# Patient Record
Sex: Female | Born: 1985 | Race: White | Hispanic: No | Marital: Single | Smoking: Current every day smoker
Health system: Southern US, Community
[De-identification: ages and names within clinical notes are randomized; demographics above are authoritative.]

## PROBLEM LIST (undated history)

## (undated) HISTORY — PX: TONSILLECTOMY: SUR1361

---
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
## 2007-01-31 ENCOUNTER — Emergency Department: Payer: Self-pay | Admitting: Emergency Medicine

## 2008-04-22 ENCOUNTER — Emergency Department: Payer: Self-pay | Admitting: Emergency Medicine

## 2009-03-30 ENCOUNTER — Emergency Department: Payer: Self-pay | Admitting: Emergency Medicine

## 2009-11-20 ENCOUNTER — Emergency Department: Payer: Self-pay | Admitting: Emergency Medicine

## 2010-12-21 ENCOUNTER — Emergency Department: Payer: Self-pay | Admitting: Emergency Medicine

## 2012-01-26 ENCOUNTER — Emergency Department: Payer: Self-pay | Admitting: Emergency Medicine

## 2012-01-26 LAB — URINALYSIS, COMPLETE
Bacteria: NONE SEEN
Bilirubin,UR: NEGATIVE
Glucose,UR: NEGATIVE mg/dL (ref 0–75)
Nitrite: POSITIVE
Ph: 7 (ref 4.5–8.0)
Protein: 100
RBC,UR: 198 /HPF (ref 0–5)
Specific Gravity: 1.017 (ref 1.003–1.030)
Squamous Epithelial: 1
WBC UR: 84 /HPF (ref 0–5)

## 2012-01-26 LAB — BASIC METABOLIC PANEL
Anion Gap: 9 (ref 7–16)
BUN: 7 mg/dL (ref 7–18)
Calcium, Total: 9.5 mg/dL (ref 8.5–10.1)
Chloride: 109 mmol/L — ABNORMAL HIGH (ref 98–107)
Co2: 24 mmol/L (ref 21–32)
Creatinine: 0.84 mg/dL (ref 0.60–1.30)
EGFR (African American): >60
EGFR (Non-African Amer.): >60
Glucose: 97 mg/dL (ref 65–99)
Osmolality: 281 (ref 275–301)
Potassium: 4 mmol/L (ref 3.5–5.1)
Sodium: 142 mmol/L (ref 136–145)

## 2012-01-26 LAB — CBC
HCT: 45.7 % (ref 35.0–47.0)
HGB: 15.3 g/dL (ref 12.0–16.0)
MCH: 31.4 pg (ref 26.0–34.0)
MCHC: 33.5 g/dL (ref 32.0–36.0)
MCV: 94 fL (ref 80–100)
Platelet: 280 10*3/uL (ref 150–440)
RBC: 4.88 10*6/uL (ref 3.80–5.20)
RDW: 14 % (ref 11.5–14.5)
WBC: 20.5 10*3/uL — ABNORMAL HIGH (ref 3.6–11.0)

## 2012-01-26 LAB — DIFFERENTIAL
Basophil #: 0.1 10*3/uL (ref 0.0–0.1)
Basophil %: 0.3 %
Eosinophil #: 0 10*3/uL (ref 0.0–0.7)
Eosinophil %: 0 %
Lymphocyte #: 2 10*3/uL (ref 1.0–3.6)
Lymphocyte %: 9.9 %
Monocyte #: 0.9 x10 3/mm (ref 0.2–0.9)
Monocyte %: 4.3 %
Neutrophil #: 17.5 10*3/uL — ABNORMAL HIGH (ref 1.4–6.5)
Neutrophil %: 85.5 %

## 2012-01-26 LAB — HCG, QUANTITATIVE, PREGNANCY: Beta Hcg, Quant.: <1 m[IU]/mL — ABNORMAL LOW

## 2012-01-26 LAB — PREGNANCY, URINE: Pregnancy Test, Urine: NEGATIVE m[IU]/mL

## 2012-12-14 ENCOUNTER — Emergency Department: Payer: Self-pay | Admitting: Emergency Medicine

## 2013-02-09 ENCOUNTER — Emergency Department: Payer: Self-pay | Admitting: Emergency Medicine

## 2013-02-11 ENCOUNTER — Emergency Department: Payer: Self-pay | Admitting: Emergency Medicine

## 2013-02-12 ENCOUNTER — Emergency Department: Payer: Self-pay | Admitting: Emergency Medicine

## 2013-10-31 ENCOUNTER — Emergency Department: Payer: Self-pay | Admitting: Emergency Medicine

## 2013-12-17 ENCOUNTER — Emergency Department: Payer: Self-pay | Admitting: Emergency Medicine

## 2014-09-01 ENCOUNTER — Emergency Department: Payer: Self-pay | Admitting: Internal Medicine

## 2014-09-01 LAB — CBC
HCT: 46.5 % (ref 35.0–47.0)
HGB: 15.3 g/dL (ref 12.0–16.0)
MCH: 31.4 pg (ref 26.0–34.0)
MCHC: 33 g/dL (ref 32.0–36.0)
MCV: 95 fL (ref 80–100)
Platelet: 287 10*3/uL (ref 150–440)
RBC: 4.89 10*6/uL (ref 3.80–5.20)
RDW: 13.8 % (ref 11.5–14.5)
WBC: 15.7 10*3/uL — ABNORMAL HIGH (ref 3.6–11.0)

## 2014-09-01 LAB — COMPREHENSIVE METABOLIC PANEL
Albumin: 3.9 g/dL (ref 3.4–5.0)
Alkaline Phosphatase: 65 U/L (ref 46–116)
Anion Gap: 7 (ref 7–16)
BUN: 11 mg/dL (ref 7–18)
Bilirubin,Total: 0.4 mg/dL (ref 0.2–1.0)
Calcium, Total: 9.1 mg/dL (ref 8.5–10.1)
Chloride: 107 mmol/L (ref 98–107)
Co2: 26 mmol/L (ref 21–32)
Creatinine: 0.77 mg/dL (ref 0.60–1.30)
EGFR (African American): >60
EGFR (Non-African Amer.): >60
Glucose: 84 mg/dL (ref 65–99)
Osmolality: 278 (ref 275–301)
Potassium: 3.8 mmol/L (ref 3.5–5.1)
SGOT(AST): 29 U/L (ref 15–37)
SGPT (ALT): 16 U/L (ref 14–63)
Sodium: 140 mmol/L (ref 136–145)
Total Protein: 7.4 g/dL (ref 6.4–8.2)

## 2014-09-01 LAB — URINALYSIS, COMPLETE
Bilirubin,UR: NEGATIVE
Blood: NEGATIVE
Glucose,UR: NEGATIVE mg/dL (ref 0–75)
Ketone: NEGATIVE
Nitrite: NEGATIVE
Ph: 6 (ref 4.5–8.0)
Protein: NEGATIVE
RBC,UR: 6 /HPF (ref 0–5)
Specific Gravity: 1.029 (ref 1.003–1.030)
Squamous Epithelial: 10
WBC UR: 12 /HPF (ref 0–5)

## 2014-09-01 LAB — LIPASE, BLOOD: Lipase: 132 U/L (ref 73–393)

## 2014-09-02 LAB — URINE CULTURE

## 2015-10-23 ENCOUNTER — Emergency Department
Admission: EM | Admit: 2015-10-23 | Discharge: 2015-10-23 | Disposition: A | Discharge status: Home / Self Care | Payer: Self-pay | Attending: Emergency Medicine | Admitting: Emergency Medicine

## 2015-10-23 ENCOUNTER — Encounter: Payer: Self-pay | Admitting: Medical Oncology

## 2015-10-23 DIAGNOSIS — G501 Atypical facial pain: Secondary | ICD-10-CM | POA: Insufficient documentation

## 2015-10-23 DIAGNOSIS — J301 Allergic rhinitis due to pollen: Secondary | ICD-10-CM | POA: Insufficient documentation

## 2015-10-23 DIAGNOSIS — F17200 Nicotine dependence, unspecified, uncomplicated: Secondary | ICD-10-CM | POA: Insufficient documentation

## 2015-10-23 MED ORDER — BENZONATATE 100 MG PO CAPS: 100.0000 mg | ORAL_CAPSULE | Freq: Three times a day (TID) | ORAL | Status: DC | PRN

## 2015-10-23 MED ORDER — FLUTICASONE PROPIONATE 50 MCG/ACT NA SUSP: 2.0000 | Freq: Every day | NASAL | Status: DC

## 2015-10-23 MED ORDER — CETIRIZINE-PSEUDOEPHEDRINE ER 5-120 MG PO TB12
1.0000 | ORAL_TABLET | Freq: Two times a day (BID) | ORAL | Status: DC
Start: 2015-10-23 — End: 2017-09-10

## 2015-10-23 NOTE — ED Provider Notes (Signed)
[facility] Emergency Department Provider Note ____________________________________________  Time seen: 806  I have reviewed the triage vital signs and the nursing notes.  HISTORY  Chief Complaint  Facial Pain and Nasal Congestion  HPI Anna Aguirre is a 30 y.o. female presents to the ED for evaluation management of 3 days complaint of sinus pain and nasal congestion. She is also noticed some intermittent fevers with her symptoms. She denies a history of seasonal allergy symptoms, but does note onset after she spent the day until in the garden. She is been taking TheraFlu and ibuprofen for symptom management, but denies any significant benefit. She describes burning eyes, stuffy nose, clear runny nose, and postnasal drainage. She reports her pain at 10/10 in triage.   History reviewed. No pertinent past medical history.  There are no active problems to display for this patient.  Past Surgical History  Procedure Laterality Date  . Tonsillectomy      Current Outpatient Rx  Name  Route  Sig  Dispense  Refill  . benzonatate (TESSALON PERLES) 100 MG capsule   Oral   Take 1 capsule (100 mg total) by mouth 3 (three) times daily as needed for cough (Take 1-2 per dose).   30 capsule   0   . cetirizine-pseudoephedrine (ZYRTEC-D) 5-120 MG tablet   Oral   Take 1 tablet by mouth 2 (two) times daily.   30 tablet   0   . fluticasone (FLONASE) 50 MCG/ACT nasal spray   Each Nare   Place 2 sprays into both nostrils daily.   16 g   0     Allergies Codeine  No family history on file.  Social History Social History  Substance Use Topics  . Smoking status: Current Every Day Smoker  . Smokeless tobacco: None  . Alcohol Use: None   Review of Systems  Constitutional: Reports low-grade fever. Eyes: Negative for visual changes. ENT: Negative for sore throat. Sinus congestion and nasal drainage Cardiovascular: Negative for chest pain. Respiratory: Negative  for shortness of breath. Skin: Negative for rash. Neurological: Negative for headaches, focal weakness or numbness. ____________________________________________  PHYSICAL EXAM:  VITAL SIGNS: ED Triage Vitals  Enc Vitals Group     BP 10/23/15 0759 113/64 mmHg     Pulse Rate 10/23/15 0759 85     Resp 10/23/15 0759 20     Temp 10/23/15 0759 98.8 F (37.1 C)     Temp Source 10/23/15 0759 Oral     SpO2 10/23/15 0759 97 %     Weight 10/23/15 0759 130 lb (58.968 kg)     Height 10/23/15 0759 5\' 5"  (1.651 m)     Head Cir --      Peak Flow --      Pain Score 10/23/15 0759 10     Pain Loc --      Pain Edu? --      Excl. in GC? --    Constitutional: Alert and oriented. Well appearing and in no distress. Head: Normocephalic and atraumatic.      Eyes: Conjunctivae are normal. PERRL. Normal extraocular movements      Ears: Canals clear. TMs intact bilaterally.   Nose: Nasal turbinate edema & congestion. Clear nasal rhinorrhea.   Mouth/Throat: Mucous membranes are moist.   Neck: Supple. No thyromegaly. Hematological/Lymphatic/Immunological: No cervical lymphadenopathy. Cardiovascular: Normal rate, regular rhythm.  Respiratory: Normal respiratory effort. No wheezes/rales/rhonchi. Musculoskeletal: Nontender with normal range of motion in all extremities.  Neurologic:  Normal gait  without ataxia. Normal speech and language. No gross focal neurologic deficits are appreciated. Skin:  Skin is warm, dry and intact. No rash noted. Psychiatric: Mood and affect are normal. Patient exhibits appropriate insight and judgment. ____________________________________________  INITIAL IMPRESSION / ASSESSMENT AND PLAN / ED COURSE  Patient with symptoms consistent with an acute rhinitis likely due to allergies. She will be discharged with prescriptions for Flonase, Claritin-D, and instructions on sinus lavage. She is also encouraged to consider continuing her ibuprofen as needed. She will follow-up  with local family clinic for ongoing management. ____________________________________________  FINAL CLINICAL IMPRESSION(S) / ED DIAGNOSES  Final diagnoses:  Allergic rhinitis due to pollen      [employer], PA-C 10/23/15 1610  Emily Filbert, MD 10/23/15 1027

## 2015-10-23 NOTE — ED Notes (Signed)
States she has had some nasal congestion with slight fever since last Friday ..having some sinus pressure .Marland Kitchen.occasional non prod cough

## 2015-10-23 NOTE — ED Notes (Signed)
Pt reports sinus pain and nasal congestion with fever since Friday.

## 2015-10-23 NOTE — Discharge Instructions (Signed)
Allergic Rhinitis Allergic rhinitis is when the mucous membranes in the nose respond to allergens. Allergens are particles in the air that cause your body to have an allergic reaction. This causes you to release allergic antibodies. Through a chain of events, these eventually cause you to release histamine into the blood stream. Although meant to protect the body, it is this release of histamine that causes your discomfort, such as frequent sneezing, congestion, and an itchy, runny nose.  CAUSES Seasonal allergic rhinitis (hay fever) is caused by pollen allergens that may come from grasses, trees, and weeds. Year-round allergic rhinitis (perennial allergic rhinitis) is caused by allergens such as house dust mites, pet dander, and mold spores. SYMPTOMS  Nasal stuffiness (congestion).  Itchy, runny nose with sneezing and tearing of the eyes. DIAGNOSIS Your health care provider can help you determine the allergen or allergens that trigger your symptoms. If you and your health care provider are unable to determine the allergen, skin or blood testing may be used. Your health care provider will diagnose your condition after taking your health history and performing a physical exam. Your health care provider may assess you for other related conditions, such as asthma, pink eye, or an ear infection. TREATMENT Allergic rhinitis does not have a cure, but it can be controlled by:  Medicines that block allergy symptoms. These may include allergy shots, nasal sprays, and oral antihistamines.  Avoiding the allergen. Hay fever may often be treated with antihistamines in pill or nasal spray forms. Antihistamines block the effects of histamine. There are over-the-counter medicines that may help with nasal congestion and swelling around the eyes. Check with your health care provider before taking or giving this medicine. If avoiding the allergen or the medicine prescribed do not work, there are many new medicines  your health care provider can prescribe. Stronger medicine may be used if initial measures are ineffective. Desensitizing injections can be used if medicine and avoidance does not work. Desensitization is when a patient is given ongoing shots until the body becomes less sensitive to the allergen. Make sure you follow up with your health care provider if problems continue. HOME CARE INSTRUCTIONS It is not possible to completely avoid allergens, but you can reduce your symptoms by taking steps to limit your exposure to them. It helps to know exactly what you are allergic to so that you can avoid your specific triggers. SEEK MEDICAL CARE IF:  You have a fever.  You develop a cough that does not stop easily (persistent).  You have shortness of breath.  You start wheezing.  Symptoms interfere with normal daily activities.   This information is not intended to replace advice given to you by your health care provider. Make sure you discuss any questions you have with your health care provider.   Document Released: 04/08/2001 Document Revised: 08/04/2014 Document Reviewed: 03/21/2013 Elsevier Interactive Patient Education 2016 Elsevier Inc.  Hay Fever  Hay fever is a type of allergy that people have to things like grass, animals, or pollen from plants and flowers. It cannot be passed from one person to another. You cannot cure hay fever, but there are things that may help relieve your problems (symptoms). HOME CARE  Avoid the things that may be causing your problems.  Take all medicine as told by your doctor. GET HELP RIGHT AWAY IF:  You have asthma, a cough, and you start making whistling sounds when breathing (wheezing).  Your tongue or lips are puffy (swollen).  You have trouble  breathing.  You feel lightheaded or like you will pass out (faint).  You have a fever.  Your problems are getting worse and your medicine is not helping.  Your treatment was working, but your problems  have come back.  You are stuffed up (congested) and have pressure in your face.  You have a headache.  You have cold sweats. MAKE SURE YOU:  Understand these instructions.  Will watch your condition.  Will get help right away if you are not doing well or get worse.   This information is not intended to replace advice given to you by your health care provider. Make sure you discuss any questions you have with your health care provider.   Document Released: 11/13/2010 Document Revised: 10/06/2011 Document Reviewed: 01/24/2015 Elsevier Interactive Patient Education [employer] Elsevier Inc.  Take the prescription meds as directed. Consider using a room humidifier overnight. Follow-up with one of the community clinics for ongoing management.

## 2016-05-02 ENCOUNTER — Emergency Department
Admission: EM | Admit: 2016-05-02 | Discharge: 2016-05-02 | Disposition: A | Discharge status: Home / Self Care | Payer: Self-pay | Attending: Student in an Organized Health Care Education/Training Program | Admitting: Student in an Organized Health Care Education/Training Program

## 2016-05-02 ENCOUNTER — Emergency Department: Payer: Self-pay

## 2016-05-02 DIAGNOSIS — R109 Unspecified abdominal pain: Secondary | ICD-10-CM

## 2016-05-02 DIAGNOSIS — N12 Tubulo-interstitial nephritis, not specified as acute or chronic: Secondary | ICD-10-CM | POA: Insufficient documentation

## 2016-05-02 DIAGNOSIS — F17200 Nicotine dependence, unspecified, uncomplicated: Secondary | ICD-10-CM | POA: Insufficient documentation

## 2016-05-02 DIAGNOSIS — R1031 Right lower quadrant pain: Secondary | ICD-10-CM

## 2016-05-02 LAB — CBC WITH DIFFERENTIAL/PLATELET
BASOS PCT: 1 %
Basophils Absolute: 0.1 10*3/uL (ref 0–0.1)
EOS ABS: 0.1 10*3/uL (ref 0–0.7)
Eosinophils Relative: 1 %
HCT: 44.5 % (ref 35.0–47.0)
Hemoglobin: 15.4 g/dL (ref 12.0–16.0)
Lymphocytes Relative: 20 %
Lymphs Abs: 2.3 10*3/uL (ref 1.0–3.6)
MCH: 31.2 pg (ref 26.0–34.0)
MCHC: 34.7 g/dL (ref 32.0–36.0)
MCV: 89.9 fL (ref 80.0–100.0)
MONO ABS: 1 10*3/uL — AB (ref 0.2–0.9)
MONOS PCT: 8 %
Neutro Abs: 8.5 10*3/uL — ABNORMAL HIGH (ref 1.4–6.5)
Neutrophils Relative %: 70 %
PLATELETS: 272 10*3/uL (ref 150–440)
RBC: 4.95 MIL/uL (ref 3.80–5.20)
RDW: 14 % (ref 11.5–14.5)
WBC: 12 10*3/uL — ABNORMAL HIGH (ref 3.6–11.0)

## 2016-05-02 LAB — URINALYSIS COMPLETE WITH MICROSCOPIC (ARMC ONLY)
Bilirubin Urine: NEGATIVE
Glucose, UA: NEGATIVE mg/dL
Hgb urine dipstick: NEGATIVE
KETONES UR: NEGATIVE mg/dL
NITRITE: POSITIVE — AB
PH: 6 (ref 5.0–8.0)
PROTEIN: NEGATIVE mg/dL
SPECIFIC GRAVITY, URINE: 1.02 (ref 1.005–1.030)

## 2016-05-02 LAB — COMPREHENSIVE METABOLIC PANEL
ALBUMIN: 4.3 g/dL (ref 3.5–5.0)
ALT: 11 U/L — ABNORMAL LOW (ref 14–54)
ANION GAP: 6 (ref 5–15)
AST: 17 U/L (ref 15–41)
Alkaline Phosphatase: 54 U/L (ref 38–126)
BUN: 11 mg/dL (ref 6–20)
CHLORIDE: 106 mmol/L (ref 101–111)
CO2: 26 mmol/L (ref 22–32)
Calcium: 9.3 mg/dL (ref 8.9–10.3)
Creatinine, Ser: 0.67 mg/dL (ref 0.44–1.00)
GFR calc Af Amer: >60 mL/min (ref >60)
GFR calc non Af Amer: >60 mL/min (ref >60)
GLUCOSE: 101 mg/dL — AB (ref 65–99)
POTASSIUM: 4.2 mmol/L (ref 3.5–5.1)
SODIUM: 138 mmol/L (ref 135–145)
TOTAL PROTEIN: 7.7 g/dL (ref 6.5–8.1)
Total Bilirubin: 0.3 mg/dL (ref 0.3–1.2)

## 2016-05-02 LAB — PREGNANCY, URINE: Preg Test, Ur: NEGATIVE

## 2016-05-02 MED ORDER — SODIUM CHLORIDE 0.9 % IV BOLUS (SEPSIS)
1000.0000 mL | Freq: Once | INTRAVENOUS | Status: AC
  Administered 2016-05-02: 1000 mL via INTRAVENOUS

## 2016-05-02 MED ORDER — PROMETHAZINE HCL 25 MG/ML IJ SOLN
12.5000 mg | Freq: Once | INTRAMUSCULAR | Status: AC
  Administered 2016-05-02: 12.5 mg via INTRAVENOUS
  Filled 2016-05-02: qty 1

## 2016-05-02 MED ORDER — CEPHALEXIN 500 MG PO CAPS: 500.0000 mg | ORAL_CAPSULE | Freq: Four times a day (QID) | ORAL | 0 refills | Status: AC

## 2016-05-02 MED ORDER — MORPHINE SULFATE (PF) 4 MG/ML IV SOLN
4.0000 mg | INTRAVENOUS | Status: DC | PRN
  Administered 2016-05-02 (×2): 4 mg via INTRAVENOUS
  Filled 2016-05-02 (×2): qty 1

## 2016-05-02 MED ORDER — IOPAMIDOL (ISOVUE-300) INJECTION 61%
30.0000 mL | Freq: Once | INTRAVENOUS | Status: AC | PRN
  Administered 2016-05-02: 30 mL via ORAL

## 2016-05-02 MED ORDER — CEPHALEXIN 500 MG PO CAPS
500.0000 mg | ORAL_CAPSULE | Freq: Once | ORAL | Status: AC
  Administered 2016-05-02: 500 mg via ORAL
  Filled 2016-05-02: qty 1

## 2016-05-02 MED ORDER — PROMETHAZINE HCL 12.5 MG PO TABS: 12.5000 mg | ORAL_TABLET | Freq: Four times a day (QID) | ORAL | 0 refills | Status: DC | PRN

## 2016-05-02 MED ORDER — IOPAMIDOL (ISOVUE-300) INJECTION 61%
100.0000 mL | Freq: Once | INTRAVENOUS | Status: AC | PRN
  Administered 2016-05-02: 100 mL via INTRAVENOUS

## 2016-05-02 NOTE — ED Triage Notes (Signed)
Pt arrives from home with reports of right sided flank pain that started two days ago but has gotten worse overnight.  9/10 pain upon arrival  Hx kidney stones  Denies any problems with urination

## 2016-05-02 NOTE — ED Notes (Signed)
Per Dr. Roxan Hockeyobinson OK to go ahead and give another dose of morphine 4 mg.

## 2016-05-02 NOTE — ED Notes (Addendum)
Patient c/o right side pain for the past 2 days. Patient states that last night she started vomiting. Patient has vomited twice in the last 24 hours. Patient denies burning with urination but states that she is currently on her cycle so she is not sure if there has been any blood in her urine

## 2016-05-02 NOTE — ED Provider Notes (Signed)
[facility] Emergency Department Provider Note    First MD Initiated Contact with Patient 05/02/16 [phone]     (approximate)  I have reviewed the triage vital signs and the nursing notes.   HISTORY  Chief Complaint Flank Pain    HPI Anna Aguirre is a 30 y.o. female with a history of kidney stones presents with 2 days of gradually worsening right lower quadrant pain and right flank pain associated with nausea and vomiting. States that she is currently on her menstrual cycle. Denies any chance of being pregnant. No vaginal discharge. Denies any dysuria. States this feels different from her kidney stones as more of a dull achy pain. No diarrhea. No blood in her stools.   History reviewed. No pertinent past medical history.  There are no active problems to display for this patient.   Past Surgical History:  Procedure Laterality Date  . TONSILLECTOMY      Prior to Admission medications   Medication Sig Start Date End Date Taking? Authorizing Provider  cephALEXin (KEFLEX) 500 MG capsule Take 1 capsule (500 mg total) by mouth 4 (four) times daily. 05/02/16 05/09/16  Willy Eddy, MD  cetirizine-pseudoephedrine (ZYRTEC-D) 5-120 MG tablet Take 1 tablet by mouth 2 (two) times daily. 10/23/15   Jenise V Bacon Menshew, PA-C  fluticasone (FLONASE) 50 MCG/ACT nasal spray Place 2 sprays into both nostrils daily. 10/23/15   Jenise V Bacon Menshew, PA-C  promethazine (PHENERGAN) 12.5 MG tablet Take 1 tablet (12.5 mg total) by mouth every 6 (six) hours as needed for nausea or vomiting. 05/02/16   Willy Eddy, MD    Allergies Codeine  No family history on file.  Social History Social History  Substance Use Topics  . Smoking status: Current Every Day Smoker  . Smokeless tobacco: Not on file  . Alcohol use No    Review of Systems Patient denies headaches, rhinorrhea, blurry vision, numbness, shortness of breath, chest pain, edema, cough, abdominal pain,  nausea, vomiting, diarrhea, dysuria, fevers, rashes or hallucinations unless otherwise stated above in HPI. ____________________________________________   PHYSICAL EXAM:  VITAL SIGNS: Vitals:   05/02/16 1000 05/02/16 1115  BP: 114/75 114/77  Pulse: 68 74  Resp:  16  Temp:      Constitutional: Alert and oriented. Well appearing and in no acute distress. Eyes: Conjunctivae are normal. PERRL. EOMI. Head: Atraumatic. Nose: No congestion/rhinnorhea. Mouth/Throat: Mucous membranes are moist.  Oropharynx non-erythematous. Neck: No stridor. Painless ROM. No cervical spine tenderness to palpation Hematological/Lymphatic/Immunilogical: No cervical lymphadenopathy. Cardiovascular: Normal rate, regular rhythm. Grossly normal heart sounds.  Good peripheral circulation. Respiratory: Normal respiratory effort.  No retractions. Lungs CTAB. Gastrointestinal: Soft with tenderness to palpation right lower quadrant without guarding or rebound No distention. No abdominal bruits. Right CVA tenderness. Genitourinary:  Musculoskeletal: No lower extremity tenderness nor edema.  No joint effusions. Neurologic:  Normal speech and language. No gross focal neurologic deficits are appreciated. No gait instability. Skin:  Skin is warm, dry and intact. No rash noted. Psychiatric: Mood and affect are normal. Speech and behavior are normal.  ____________________________________________   LABS (all labs ordered are listed, but only abnormal results are displayed)  Results for orders placed or performed during the hospital encounter of 05/02/16 (from the past 24 hour(s))  CBC with Differential/Platelet     Status: Abnormal   Collection Time: 05/02/16  8:09 AM  Result Value Ref Range   WBC 12.0 (H) 3.6 - 11.0 K/uL   RBC 4.95 3.80 - 5.20  MIL/uL   Hemoglobin 15.4 12.0 - 16.0 g/dL   HCT 16.144.5 09.635.0 - 04.547.0 %   MCV 89.9 80.0 - 100.0 fL   MCH 31.2 26.0 - 34.0 pg   MCHC 34.7 32.0 - 36.0 g/dL   RDW 40.914.0 81.111.5 - 91.414.5  %   Platelets 272 150 - 440 K/uL   Neutrophils Relative % 70 %   Neutro Abs 8.5 (H) 1.4 - 6.5 K/uL   Lymphocytes Relative 20 %   Lymphs Abs 2.3 1.0 - 3.6 K/uL   Monocytes Relative 8 %   Monocytes Absolute 1.0 (H) 0.2 - 0.9 K/uL   Eosinophils Relative 1 %   Eosinophils Absolute 0.1 0 - 0.7 K/uL   Basophils Relative 1 %   Basophils Absolute 0.1 0 - 0.1 K/uL  Comprehensive metabolic panel     Status: Abnormal   Collection Time: 05/02/16  8:09 AM  Result Value Ref Range   Sodium 138 135 - 145 mmol/L   Potassium 4.2 3.5 - 5.1 mmol/L   Chloride 106 101 - 111 mmol/L   CO2 26 22 - 32 mmol/L   Glucose, Bld 101 (H) 65 - 99 mg/dL   BUN 11 6 - 20 mg/dL   Creatinine, Ser 7.820.67 0.44 - 1.00 mg/dL   Calcium 9.3 8.9 - 95.610.3 mg/dL   Total Protein 7.7 6.5 - 8.1 g/dL   Albumin 4.3 3.5 - 5.0 g/dL   AST 17 15 - 41 U/L   ALT 11 (L) 14 - 54 U/L   Alkaline Phosphatase 54 38 - 126 U/L   Total Bilirubin 0.3 0.3 - 1.2 mg/dL   GFR calc non Af Amer >60 >60 mL/min   GFR calc Af Amer >60 >60 mL/min   Anion gap 6 5 - 15  Urinalysis complete, with microscopic ([facility] only)     Status: Abnormal   Collection Time: 05/02/16  9:11 AM  Result Value Ref Range   Color, Urine YELLOW (A) YELLOW   APPearance HAZY (A) CLEAR   Glucose, UA NEGATIVE NEGATIVE mg/dL   Bilirubin Urine NEGATIVE NEGATIVE   Ketones, ur NEGATIVE NEGATIVE mg/dL   Specific Gravity, Urine 1.020 1.005 - 1.030   Hgb urine dipstick NEGATIVE NEGATIVE   pH 6.0 5.0 - 8.0   Protein, ur NEGATIVE NEGATIVE mg/dL   Nitrite POSITIVE (A) NEGATIVE   Leukocytes, UA 1+ (A) NEGATIVE   RBC / HPF 0-5 0 - 5 RBC/hpf   WBC, UA 6-30 0 - 5 WBC/hpf   Bacteria, UA RARE (A) NONE SEEN   Squamous Epithelial / LPF 6-30 (A) NONE SEEN   Mucous PRESENT   Pregnancy, urine     Status: None   Collection Time: 05/02/16  9:11 AM  Result Value Ref Range   Preg Test, Ur NEGATIVE NEGATIVE    ____________________________________________ ____________________________________________  RADIOLOGY  I personally reviewed all radiographic images ordered to evaluate for the above acute complaints and reviewed radiology reports and findings.  These findings were personally discussed with the patient.  Please see medical record for radiology report.  ____________________________________________   PROCEDURES  Procedure(s) performed: none    Critical Care performed: no ____________________________________________   INITIAL IMPRESSION / ASSESSMENT AND PLAN / ED COURSE  Pertinent labs & imaging results that were available during my care of the patient were reviewed by me and considered in my medical decision making (see chart for details).  DDX: Appendicitis, pyelonephritis, stone, colitis, suicidal strain  Maryanna ShapeShanna L Quilter is a 30 y.o. who presents to  the ED with acute right flank pain and right lower quadrant pain. Exam concerning for possible appendicitis. Differential also includes Pilo versus stone. Patient no acute distress but does appear uncomfortable. We'll provide IV pain medication as well as IV fluid for dehydration.  The patient will be placed on continuous pulse oximetry and telemetry for monitoring.  Laboratory evaluation will be sent to evaluate for the above complaints.     Clinical Course   Discussed results of CT imaging with patient. No evidence of acute appendicitis. Presentation more consistent with acute pyelonephritis. As patient is able tolerate oral hydration and is otherwise hemodynamic stable. Do feel patient is appropriate for further outpatient management. Discussed signs and symptoms for which patient should return immediately to the hospital.  Have discussed with the patient and available family all diagnostics and treatments performed thus far and all questions were answered to the best of my ability. The patient demonstrates understanding and agreement  with plan.   ____________________________________________   FINAL CLINICAL IMPRESSION(S) / ED DIAGNOSES  Final diagnoses:  Pyelonephritis  Flank pain  RLQ abdominal pain      NEW MEDICATIONS STARTED DURING THIS VISIT:  Discharge Medication List as of 05/02/2016 11:01 AM    START taking these medications   Details  cephALEXin (KEFLEX) 500 MG capsule Take 1 capsule (500 mg total) by mouth 4 (four) times daily., Starting Fri 05/02/2016, Until Fri 05/09/2016, Print    promethazine (PHENERGAN) 12.5 MG tablet Take 1 tablet (12.5 mg total) by mouth every 6 (six) hours as needed for nausea or vomiting., Starting Fri 05/02/2016, Print         Note:  This document was prepared using Dragon voice recognition software and may include unintentional dictation errors.    Willy Eddy, MD 05/02/16 [phone]

## 2017-03-17 ENCOUNTER — Emergency Department: Payer: Self-pay

## 2017-03-17 ENCOUNTER — Encounter: Payer: Self-pay | Admitting: *Deleted

## 2017-03-17 ENCOUNTER — Emergency Department
Admission: EM | Admit: 2017-03-17 | Discharge: 2017-03-17 | Disposition: A | Discharge status: Home / Self Care | Payer: Self-pay | Attending: Emergency Medicine | Admitting: Emergency Medicine

## 2017-03-17 DIAGNOSIS — F17210 Nicotine dependence, cigarettes, uncomplicated: Secondary | ICD-10-CM | POA: Insufficient documentation

## 2017-03-17 DIAGNOSIS — F419 Anxiety disorder, unspecified: Secondary | ICD-10-CM | POA: Insufficient documentation

## 2017-03-17 DIAGNOSIS — Z79899 Other long term (current) drug therapy: Secondary | ICD-10-CM | POA: Insufficient documentation

## 2017-03-17 LAB — CBC
HEMATOCRIT: 43.4 % (ref 35.0–47.0)
Hemoglobin: 15 g/dL (ref 12.0–16.0)
MCH: 31.6 pg (ref 26.0–34.0)
MCHC: 34.7 g/dL (ref 32.0–36.0)
MCV: 91.1 fL (ref 80.0–100.0)
PLATELETS: 298 10*3/uL (ref 150–440)
RBC: 4.76 MIL/uL (ref 3.80–5.20)
RDW: 13.8 % (ref 11.5–14.5)
WBC: 10.6 10*3/uL (ref 3.6–11.0)

## 2017-03-17 LAB — BASIC METABOLIC PANEL
ANION GAP: 9 (ref 5–15)
BUN: 10 mg/dL (ref 6–20)
CALCIUM: 9.8 mg/dL (ref 8.9–10.3)
CHLORIDE: 105 mmol/L (ref 101–111)
CO2: 25 mmol/L (ref 22–32)
Creatinine, Ser: 0.62 mg/dL (ref 0.44–1.00)
GFR calc non Af Amer: >60 mL/min (ref >60)
Glucose, Bld: 90 mg/dL (ref 65–99)
Potassium: 3.8 mmol/L (ref 3.5–5.1)
SODIUM: 139 mmol/L (ref 135–145)

## 2017-03-17 LAB — TROPONIN I: Troponin I: <0.03 ng/mL (ref <0.03)

## 2017-03-17 MED ORDER — LORAZEPAM 1 MG PO TABS
ORAL_TABLET | ORAL | Status: AC
  Filled 2017-03-17: qty 1

## 2017-03-17 MED ORDER — LORAZEPAM 1 MG PO TABS
1.0000 mg | ORAL_TABLET | Freq: Once | ORAL | Status: AC
  Administered 2017-03-17: 1 mg via ORAL

## 2017-03-17 NOTE — Discharge Instructions (Signed)
Return to the emergency department for any new or worrisome symptoms including chest pain, shortness of breath, thoughts of hurting yourself or thoughts of hurting anyone else or if you are in an unsafe environment.

## 2017-03-17 NOTE — ED Triage Notes (Signed)
Pt to ED reporting generalized chest pain starting today. PT reports chest pain started before hyperventilation. Pt is hyperventilating gin triage. Family verbalized pt has been under a lot of stress but [t will not elaborate on this with nurse. Pt is tearful. Pt reports SOB, nausea and feeling tingling in fingers and cheeks.
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
## 2017-09-10 ENCOUNTER — Encounter: Payer: Self-pay | Admitting: Emergency Medicine

## 2017-09-10 ENCOUNTER — Emergency Department
Admission: EM | Admit: 2017-09-10 | Discharge: 2017-09-10 | Disposition: A | Discharge status: Home / Self Care | Payer: Self-pay | Attending: Student in an Organized Health Care Education/Training Program | Admitting: Student in an Organized Health Care Education/Training Program

## 2017-09-10 ENCOUNTER — Other Ambulatory Visit: Payer: Self-pay

## 2017-09-10 DIAGNOSIS — F17210 Nicotine dependence, cigarettes, uncomplicated: Secondary | ICD-10-CM | POA: Insufficient documentation

## 2017-09-10 DIAGNOSIS — J101 Influenza due to other identified influenza virus with other respiratory manifestations: Secondary | ICD-10-CM | POA: Insufficient documentation

## 2017-09-10 DIAGNOSIS — R05 Cough: Secondary | ICD-10-CM | POA: Insufficient documentation

## 2017-09-10 LAB — INFLUENZA PANEL BY PCR (TYPE A & B)
INFLAPCR: POSITIVE — AB
Influenza B By PCR: NEGATIVE

## 2017-09-10 MED ORDER — PSEUDOEPH-BROMPHEN-DM 30-2-10 MG/5ML PO SYRP: 5.0000 mL | ORAL_SOLUTION | Freq: Four times a day (QID) | ORAL | 0 refills | Status: DC | PRN

## 2017-09-10 MED ORDER — OSELTAMIVIR PHOSPHATE 75 MG PO CAPS: 75.0000 mg | ORAL_CAPSULE | Freq: Two times a day (BID) | ORAL | 0 refills | Status: AC

## 2017-09-10 NOTE — ED Notes (Signed)
First nurse note   Presents with flu like sx's  Headache and cough

## 2017-09-10 NOTE — ED Notes (Signed)
Pt c/o bodyaches with cough and congestion since Tuesday. States she has not taken any medication today for her sx..afebrile in triage.

## 2017-09-10 NOTE — ED Triage Notes (Signed)
Pt reports that she developed bodyaches on Tuesday, states that even her skin hurts.

## 2017-09-10 NOTE — Discharge Instructions (Signed)
Increase fluids.  Take Tylenol or ibuprofen as needed for body aches or fever.  Bromfed-DM as needed for cough and congestion.  Begin taking Tamiflu 1 twice daily for the next 5 days for flu.  [facility] if any continued problems.  Remain out of work until you have had no fever for the last 24 hours.

## 2017-09-10 NOTE — ED Provider Notes (Signed)
[facility] Emergency Department Provider Note  ____________________________________________   First MD Initiated Contact with Patient 09/10/17 0745     (approximate)  I have reviewed the triage vital signs and the nursing notes.   HISTORY  Chief Complaint Influenza   HPI Anna Aguirre is a 32 y.o. female is here with complaint of body aches for the last 2 days.  Patient has been taking over-the-counter medication with minimal relief.  She did not get the flu shot this year.  Symptoms came on suddenly.  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  Rates her pain as an 8 out of 10.   History reviewed. No pertinent past medical history.  There are no active problems to display for this patient.   Past Surgical History:  Procedure Laterality Date  . TONSILLECTOMY      Prior to Admission medications   Medication Sig Start Date End Date Taking? Authorizing Provider  brompheniramine-pseudoephedrine-DM 30-2-10 MG/5ML syrup Take 5 mLs by mouth 4 (four) times daily as needed. 09/10/17   Tommi RumpsSummers, Isao Seltzer L, PA-C  oseltamivir (TAMIFLU) 75 MG capsule Take 1 capsule (75 mg total) by mouth 2 (two) times daily for 5 days. 09/10/17 09/15/17  Tommi RumpsSummers, Mearl Harewood L, PA-C    Allergies Codeine  No family history on file.  Social History Social History   Tobacco Use  . Smoking status: Current Every Day Smoker    Packs/day: 1.00    Types: Cigarettes  . Smokeless tobacco: Never Used  Substance Use Topics  . Alcohol use: No  . Drug use: No    Review of Systems Constitutional: Positive fever/chills Eyes: No visual changes. ENT: No sore throat. Cardiovascular: Denies chest pain. Respiratory: Denies shortness of breath.  Positive for cough. Gastrointestinal: No abdominal pain.  No nausea, no vomiting.  No diarrhea.   Musculoskeletal: Positive generalized body aches. Skin: Negative for rash. Neurological: Positive for  headache. ___________________________________________   PHYSICAL EXAM:  VITAL SIGNS: ED Triage Vitals  Enc Vitals Group     BP 09/10/17 0735 130/82     Pulse Rate 09/10/17 0735 89     Resp 09/10/17 0735 20     Temp 09/10/17 0735 98.6 F (37 C)     Temp Source 09/10/17 0735 Oral     SpO2 09/10/17 0735 96 %     Weight 09/10/17 0736 130 lb (59 kg)     Height 09/10/17 0736 5\' 4"  (1.626 m)     Head Circumference --      Peak Flow --      Pain Score 09/10/17 0735 8     Pain Loc --      Pain Edu? --      Excl. in GC? --    Constitutional: Alert and oriented. Well appearing and in no acute distress. Eyes: Conjunctivae are normal.  Head: Atraumatic. Nose: Mild congestion/rhinnorhea.  TMs are dull bilaterally. Mouth/Throat: Mucous membranes are moist.  Oropharynx non-erythematous. Neck: No stridor.   Hematological/Lymphatic/Immunilogical: No cervical lymphadenopathy. Cardiovascular: Normal rate, regular rhythm. Grossly normal heart sounds.  Good peripheral circulation. Respiratory: Normal respiratory effort.  No retractions. Lungs CTAB. Gastrointestinal: Soft and nontender. No distention.  Musculoskeletal: Moves upper and lower extremities without any difficulty.  Normal gait was noted. Neurologic:  Normal speech and language. No gross focal neurologic deficits are appreciated. No gait instability. Skin:  Skin is warm, dry and intact. No rash noted. Psychiatric: Mood and affect are normal. Speech  and behavior are normal.  ____________________________________________   LABS (all labs ordered are listed, but only abnormal results are displayed)  Labs Reviewed  INFLUENZA PANEL BY PCR (TYPE A & B) - Abnormal; Notable for the following components:      Result Value   Influenza A By PCR POSITIVE (*)    All other components within normal limits    PROCEDURES  Procedure(s) performed: None  Procedures  Critical Care performed:  No  ____________________________________________   INITIAL IMPRESSION / ASSESSMENT AND PLAN / ED COURSE Patient was given a prescription for Tamiflu and Bromfed-DM.  She is aware that she was still continued to need Tylenol or ibuprofen as needed for body aches and fever.  Increase fluids.  She is to follow-up with [facility] acute care if any continued problems.  ____________________________________________   FINAL CLINICAL IMPRESSION(S) / ED DIAGNOSES  Final diagnoses:  Influenza A     ED Discharge Orders        Ordered    brompheniramine-pseudoephedrine-DM 30-2-10 MG/5ML syrup  4 times daily PRN,   Status:  Discontinued     09/10/17 0839    brompheniramine-pseudoephedrine-DM 30-2-10 MG/5ML syrup  4 times daily PRN     09/10/17 0841    oseltamivir (TAMIFLU) 75 MG capsule  2 times daily     09/10/17 0841       Note:  This document was prepared using Dragon voice recognition software and may include unintentional dictation errors.    Tommi Rumps, PA-C 09/10/17 1142    Willy Eddy, MD 09/10/17 1150

## 2020-02-15 ENCOUNTER — Emergency Department: Payer: Self-pay

## 2020-02-15 ENCOUNTER — Emergency Department
Admission: EM | Admit: 2020-02-15 | Discharge: 2020-02-15 | Disposition: A | Discharge status: Home / Self Care | Payer: Self-pay | Attending: Emergency Medicine | Admitting: Emergency Medicine

## 2020-02-15 ENCOUNTER — Other Ambulatory Visit: Payer: Self-pay

## 2020-02-15 DIAGNOSIS — M542 Cervicalgia: Secondary | ICD-10-CM | POA: Insufficient documentation

## 2020-02-15 DIAGNOSIS — F17210 Nicotine dependence, cigarettes, uncomplicated: Secondary | ICD-10-CM | POA: Insufficient documentation

## 2020-02-15 LAB — CBC WITH DIFFERENTIAL/PLATELET
Abs Immature Granulocytes: 0.03 10*3/uL (ref 0.00–0.07)
Basophils Absolute: 0.1 10*3/uL (ref 0.0–0.1)
Basophils Relative: 1 %
Eosinophils Absolute: 0 10*3/uL (ref 0.0–0.5)
Eosinophils Relative: 1 %
HCT: 44.4 % (ref 36.0–46.0)
Hemoglobin: 15.1 g/dL — ABNORMAL HIGH (ref 12.0–15.0)
Immature Granulocytes: 0 %
Lymphocytes Relative: 14 %
Lymphs Abs: 1 10*3/uL (ref 0.7–4.0)
MCH: 31.1 pg (ref 26.0–34.0)
MCHC: 34 g/dL (ref 30.0–36.0)
MCV: 91.5 fL (ref 80.0–100.0)
Monocytes Absolute: 0.7 10*3/uL (ref 0.1–1.0)
Monocytes Relative: 9 %
Neutro Abs: 5.7 10*3/uL (ref 1.7–7.7)
Neutrophils Relative %: 75 %
Platelets: 285 10*3/uL (ref 150–400)
RBC: 4.85 MIL/uL (ref 3.87–5.11)
RDW: 13.7 % (ref 11.5–15.5)
WBC: 7.5 10*3/uL (ref 4.0–10.5)
nRBC: 0 % (ref 0.0–0.2)

## 2020-02-15 LAB — COMPREHENSIVE METABOLIC PANEL
ALT: 18 U/L (ref 0–44)
AST: 20 U/L (ref 15–41)
Albumin: 4.7 g/dL (ref 3.5–5.0)
Alkaline Phosphatase: 54 U/L (ref 38–126)
Anion gap: 13 (ref 5–15)
BUN: 9 mg/dL (ref 6–20)
CO2: 23 mmol/L (ref 22–32)
Calcium: 9.2 mg/dL (ref 8.9–10.3)
Chloride: 101 mmol/L (ref 98–111)
Creatinine, Ser: 0.7 mg/dL (ref 0.44–1.00)
GFR calc Af Amer: >60 mL/min (ref >60)
GFR calc non Af Amer: >60 mL/min (ref >60)
Glucose, Bld: 93 mg/dL (ref 70–99)
Potassium: 4.1 mmol/L (ref 3.5–5.1)
Sodium: 137 mmol/L (ref 135–145)
Total Bilirubin: 0.8 mg/dL (ref 0.3–1.2)
Total Protein: 7.8 g/dL (ref 6.5–8.1)

## 2020-02-15 LAB — URINALYSIS, COMPLETE (UACMP) WITH MICROSCOPIC
Bilirubin Urine: NEGATIVE
Glucose, UA: NEGATIVE mg/dL
Ketones, ur: 5 mg/dL — AB
Nitrite: NEGATIVE
Protein, ur: NEGATIVE mg/dL
Specific Gravity, Urine: 1.01 (ref 1.005–1.030)
pH: 7 (ref 5.0–8.0)

## 2020-02-15 LAB — POCT PREGNANCY, URINE: Preg Test, Ur: NEGATIVE

## 2020-02-15 MED ORDER — IOHEXOL 300 MG/ML  SOLN
75.0000 mL | Freq: Once | INTRAMUSCULAR | Status: AC | PRN
  Administered 2020-02-15: 75 mL via INTRAVENOUS
  Filled 2020-02-15: qty 75

## 2020-02-15 MED ORDER — CEPHALEXIN 500 MG PO CAPS: 500.0000 mg | ORAL_CAPSULE | Freq: Three times a day (TID) | ORAL | 0 refills | Status: AC

## 2020-02-15 MED ORDER — HYDROCODONE-ACETAMINOPHEN 5-325 MG PO TABS: 1.0000 | ORAL_TABLET | Freq: Four times a day (QID) | ORAL | 0 refills | Status: AC | PRN

## 2020-02-15 MED ORDER — MORPHINE SULFATE (PF) 4 MG/ML IV SOLN
4.0000 mg | Freq: Once | INTRAVENOUS | Status: AC
  Administered 2020-02-15: 4 mg via INTRAVENOUS
  Filled 2020-02-15: qty 1

## 2020-02-15 MED ORDER — CYCLOBENZAPRINE HCL 10 MG PO TABS
10.0000 mg | ORAL_TABLET | Freq: Three times a day (TID) | ORAL | 0 refills | Status: AC | PRN
Start: 2020-02-15 — End: ?

## 2020-02-15 MED ORDER — ONDANSETRON HCL 4 MG/2ML IJ SOLN
4.0000 mg | Freq: Once | INTRAMUSCULAR | Status: AC
  Administered 2020-02-15: 4 mg via INTRAVENOUS
  Filled 2020-02-15: qty 2

## 2020-02-15 MED ORDER — CYCLOBENZAPRINE HCL 10 MG PO TABS
10.0000 mg | ORAL_TABLET | Freq: Once | ORAL | Status: AC
  Administered 2020-02-15: 10 mg via ORAL
  Filled 2020-02-15: qty 1

## 2020-02-15 NOTE — ED Provider Notes (Signed)
[facility] Emergency Department Provider Note  ____________________________________________   First MD Initiated Contact with Patient 02/15/20 1134     (approximate)  I have reviewed the triage vital signs and the nursing notes.   HISTORY  Chief Complaint Neck Pain and Back Pain    HPI Anna Aguirre is a 34 y.o. female presents emergency department complaint of neck and upper back pain.  Has started having headache with same.  Pain is on the left side of the neck.  No fever or chills.  Some difficulty with swallowing.  Patient does not feel like she has strep throat.   Pain is rated at 8/10   History reviewed. No pertinent past medical history.  There are no problems to display for this patient.   Past Surgical History:  Procedure Laterality Date  . TONSILLECTOMY      Prior to Admission medications   Medication Sig Start Date End Date Taking? Authorizing Provider  cephALEXin (KEFLEX) 500 MG capsule Take 1 capsule (500 mg total) by mouth 3 (three) times daily. 02/15/20   Dakari Cregger, Roselyn Bering, PA-C  cyclobenzaprine (FLEXERIL) 10 MG tablet Take 1 tablet (10 mg total) by mouth 3 (three) times daily as needed. 02/15/20   Carlas Vandyne, Roselyn Bering, PA-C  HYDROcodone-acetaminophen (NORCO/VICODIN) 5-325 MG tablet Take 1 tablet by mouth every 6 (six) hours as needed for moderate pain. 02/15/20   Faythe Ghee, PA-C    Allergies Codeine  History reviewed. No pertinent family history.  Social History Social History   Tobacco Use  . Smoking status: Current Every Day Smoker    Packs/day: 1.00    Types: Cigarettes  . Smokeless tobacco: Never Used  Substance Use Topics  . Alcohol use: No  . Drug use: No    Review of Systems  Constitutional: No fever/chills Eyes: No visual changes. ENT: No sore throat. Respiratory: Denies cough Cardiovascular: Denies chest pain Gastrointestinal: Denies abdominal pain Genitourinary: Negative for dysuria. Musculoskeletal:  Positive for neck and for back pain. Skin: Negative for rash. Psychiatric: no mood changes,     ____________________________________________   PHYSICAL EXAM:  VITAL SIGNS: ED Triage Vitals [02/15/20 1043]  Enc Vitals Group     BP 116/84     Pulse Rate 90     Resp 16     Temp 99.3 F (37.4 C)     Temp Source Oral     SpO2 99 %     Weight 145 lb (65.8 kg)     Height 5\' 4"  (1.626 m)     Head Circumference      Peak Flow      Pain Score 8     Pain Loc      Pain Edu?      Excl. in GC?     Constitutional: Alert and oriented. Well appearing and in no acute distress. Eyes: Conjunctivae are normal.  Head: Atraumatic. Nose: No congestion/rhinnorhea. Mouth/Throat: Mucous membranes are moist.  Throat is irritated Neck:  supple no lymphadenopathy noted Cardiovascular: Normal rate, regular rhythm. Heart sounds are normal Respiratory: Normal respiratory effort.  No retractions, lungs c t a  GU: deferred Musculoskeletal: FROM all extremities, warm and well perfused, C-spine is mildly tender, neck is very tender along the left side of the SCM, decreased range of motion secondary discomfort, neurovascular is intact Neurologic:  Normal speech and language.  Skin:  Skin is warm, dry and intact. No rash noted. Psychiatric: Mood and affect are normal. Speech and behavior are  normal.  ____________________________________________   LABS (all labs ordered are listed, but only abnormal results are displayed)  Labs Reviewed  CBC WITH DIFFERENTIAL/PLATELET - Abnormal; Notable for the following components:      Result Value   Hemoglobin 15.1 (*)    All other components within normal limits  URINALYSIS, COMPLETE (UACMP) WITH MICROSCOPIC - Abnormal; Notable for the following components:   Color, Urine YELLOW (*)    APPearance CLOUDY (*)    Hgb urine dipstick SMALL (*)    Ketones, ur 5 (*)    Leukocytes,Ua SMALL (*)    Bacteria, UA RARE (*)    All other components within normal limits   COMPREHENSIVE METABOLIC PANEL  POC URINE PREG, ED  POCT PREGNANCY, URINE   ____________________________________________   ____________________________________________  RADIOLOGY  X-rays C-spine is negative CT soft tissue of the neck  ____________________________________________   PROCEDURES  Procedure(s) performed: No  Procedures    ____________________________________________   INITIAL IMPRESSION / ASSESSMENT AND PLAN / ED COURSE  Pertinent labs & imaging results that were available during my care of the patient were reviewed by me and considered in my medical decision making (see chart for details).   Patient is 34 year old female presents emergency department with increasing neck pain along with some upper back pain.  See HPI  Physical exam shows patient appear well.  She does appear to be very uncomfortable.  Left-sided neck is very tender to palpation, C-spine is mildly tender palpation, CVA tenderness noted  DDx: Muscle strain, cervical radiculopathy, retropharyngeal abscess, UTI CBC is normal, comprehensive metabolic panel is normal, POC pregnancy is negative, urinalysis has a small amount of hemoglobin with small amount of leuks and rare bacteria  X-rays C-spine is negative Due to the patient's tenderness along the SCM I do feel a soft tissue of the neck would be appropriate.  Patient's pain is much more than a normal muscle strain.  CT soft tissue of the neck does not show any retropharyngeal abscess.  I did explain findings to the patient.  Cannot find a significant reason for the neck pain and upper back pain.  She is afebrile and her WBC is normal so do not feel like she has meningitis.  Did discuss shingles with her.  If she breaks out in a rash she should return for antiviral medication.  She was given a prescription for Flexeril and Vicodin.  Take over-the-counter Tylenol and ibuprofen.  Return emergency department worsening.  She states she understands.   She is discharged stable condition.     As part of my medical decision making, I reviewed the following data within the electronic MEDICAL RECORD NUMBER History obtained from family, Nursing notes reviewed and incorporated, Labs reviewed , Old chart reviewed, Radiograph reviewed , Notes from prior ED visits and [place] Controlled Substance Database  ____________________________________________   FINAL CLINICAL IMPRESSION(S) / ED DIAGNOSES  Final diagnoses:  Acute neck pain      NEW MEDICATIONS STARTED DURING THIS VISIT:  Discharge Medication List as of 02/15/2020  2:31 PM    START taking these medications   Details  cephALEXin (KEFLEX) 500 MG capsule Take 1 capsule (500 mg total) by mouth 3 (three) times daily., Starting Wed 02/15/2020, Normal    cyclobenzaprine (FLEXERIL) 10 MG tablet Take 1 tablet (10 mg total) by mouth 3 (three) times daily as needed., Starting Wed 02/15/2020, Normal    HYDROcodone-acetaminophen (NORCO/VICODIN) 5-325 MG tablet Take 1 tablet by mouth every 6 (six) hours as needed for moderate  pain., Starting Wed 02/15/2020, Normal         Note:  This document was prepared using Dragon voice recognition software and may include unintentional dictation errors.    Faythe Ghee, PA-C 02/15/20 1617    Delton Prairie, MD 02/15/20 [phone]

## 2020-02-15 NOTE — ED Notes (Addendum)
See triage note  Presents with neck and back pain  States pain started yesterday denies any injury  Low grade temp noted on arrival

## 2020-02-15 NOTE — Discharge Instructions (Signed)
Follow-up with your regular doctor if not improving to 3 days.  Return emergency department worsening.  Take medications as prescribed.  Apply ice to the left side of the neck.  If you break out in a rash I be concerned about shingles due to the amount of stress that you have endured recently.

## 2020-02-15 NOTE — ED Triage Notes (Signed)
Pt c/o of neck and back pain with occasional L sided head pain. Began yesterday. States only pain, doesn't feel sick. Denies injury, denies falling. Denies fever.

## 2021-05-05 ENCOUNTER — Other Ambulatory Visit: Payer: Self-pay

## 2021-05-05 ENCOUNTER — Emergency Department
Admission: EM | Admit: 2021-05-05 | Discharge: 2021-05-05 | Disposition: A | Discharge status: Home / Self Care | Payer: Self-pay | Attending: Emergency Medicine | Admitting: Emergency Medicine

## 2021-05-05 DIAGNOSIS — J069 Acute upper respiratory infection, unspecified: Secondary | ICD-10-CM | POA: Insufficient documentation

## 2021-05-05 DIAGNOSIS — Z20822 Contact with and (suspected) exposure to covid-19: Secondary | ICD-10-CM | POA: Insufficient documentation

## 2021-05-05 DIAGNOSIS — F17210 Nicotine dependence, cigarettes, uncomplicated: Secondary | ICD-10-CM | POA: Insufficient documentation

## 2021-05-05 LAB — RESP PANEL BY RT-PCR (FLU A&B, COVID) ARPGX2
Influenza A by PCR: NEGATIVE
Influenza B by PCR: NEGATIVE
SARS Coronavirus 2 by RT PCR: NEGATIVE

## 2021-05-05 MED ORDER — ALBUTEROL SULFATE HFA 108 (90 BASE) MCG/ACT IN AERS: 2.0000 | INHALATION_SPRAY | Freq: Four times a day (QID) | RESPIRATORY_TRACT | 0 refills | Status: AC | PRN

## 2021-05-05 MED ORDER — IPRATROPIUM-ALBUTEROL 0.5-2.5 (3) MG/3ML IN SOLN
3.0000 mL | Freq: Once | RESPIRATORY_TRACT | Status: AC
  Administered 2021-05-05: 3 mL via RESPIRATORY_TRACT
  Filled 2021-05-05: qty 3

## 2021-05-05 MED ORDER — BENZONATATE 100 MG PO CAPS: 100.0000 mg | ORAL_CAPSULE | Freq: Three times a day (TID) | ORAL | 0 refills | Status: AC | PRN

## 2021-05-05 NOTE — ED Provider Notes (Signed)
[facility] Emergency Department Provider Note   ____________________________________________   Event Date/Time   First MD Initiated Contact with Patient 05/05/21 1418     (approximate)  I have reviewed the triage vital signs and the nursing notes.   HISTORY  Chief Complaint URI    HPI Anna Aguirre is a 35 y.o. female patient presents to the emergency room with complaint of 3 days worth of cough, chest/nasal congestion, body aches, fever, sore throat, nausea and vomiting.  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  She denies fevers.  She denies headaches, abdominal pain, chest pain, shortness of breath.  Patient has taken nothing over-the-counter to relieve symptoms.  Reports she is unvaccinated for COVID and flu.  History reviewed. No pertinent past medical history.  There are no problems to display for this patient.   Past Surgical History:  Procedure Laterality Date   TONSILLECTOMY      Prior to Admission medications   Medication Sig Start Date End Date Taking? Authorizing Provider  albuterol (VENTOLIN HFA) 108 (90 Base) MCG/ACT inhaler Inhale 2 puffs into the lungs every 6 (six) hours as needed for wheezing or shortness of breath. 05/05/21  Yes Herschell Dimes, NP  benzonatate (TESSALON PERLES) 100 MG capsule Take 1 capsule (100 mg total) by mouth 3 (three) times daily as needed for up to 5 days for cough. 05/05/21 05/10/21 Yes Herschell Dimes, NP  cephALEXin (KEFLEX) 500 MG capsule Take 1 capsule (500 mg total) by mouth 3 (three) times daily. 02/15/20   Fisher, Roselyn Bering, PA-C  cyclobenzaprine (FLEXERIL) 10 MG tablet Take 1 tablet (10 mg total) by mouth 3 (three) times daily as needed. 02/15/20   Fisher, Roselyn Bering, PA-C  HYDROcodone-acetaminophen (NORCO/VICODIN) 5-325 MG tablet Take 1 tablet by mouth every 6 (six) hours as needed for moderate pain. 02/15/20   Faythe Ghee, PA-C    Allergies Codeine  History reviewed. No  pertinent family history.  Social History Social History   Tobacco Use   Smoking status: Every Day    Packs/day: 1.00    Types: Cigarettes   Smokeless tobacco: Never  Substance Use Topics   Alcohol use: No   Drug use: No    Review of Systems  Constitutional: No fever/chills Eyes: No visual changes. ENT: Reports sore throat, nasal and chest congestion.  Denies ear pain. Cardiovascular: Denies chest pain. Respiratory: Denies shortness of breath. Gastrointestinal: No abdominal pain.  No diarrhea.  No constipation.  Positive for nausea and vomiting. Genitourinary: Negative for dysuria. Musculoskeletal: Reports generalized body aches. Skin: Negative for rash. Neurological: Negative for headaches, focal weakness or numbness. Psychiatric: Patient is tearful on exam reporting home life problems.   ____________________________________________   PHYSICAL EXAM:  VITAL SIGNS: ED Triage Vitals  Enc Vitals Group     BP 05/05/21 1415 119/73     Pulse Rate 05/05/21 1415 66     Resp 05/05/21 1415 20     Temp 05/05/21 1415 98.4 F (36.9 C)     Temp Source 05/05/21 1415 Oral     SpO2 05/05/21 1415 98 %     Weight --      Height --      Head Circumference --      Peak Flow --      Pain Score 05/05/21 1357 5     Pain Loc --      Pain Edu? --      Excl.  in GC? --     Constitutional: Alert and oriented. Well appearing and in no acute distress. Eyes: Conjunctivae are normal. PERRL. EOMI. Head: Atraumatic. Nose: Positive nasal congestion, chest congestion, rhinorrhea. Mouth/Throat: Mucous membranes are moist.  Reports sore throat however no erythema noted. tonsils normal. Neck: Submandibular lymphadenopathy. Hematological/Lymphatic/Immunilogical: No cervical lymphadenopathy. Cardiovascular: Normal rate, regular rhythm. Grossly normal heart sounds.  Good peripheral circulation. Respiratory: Normal respiratory effort.  No retractions.  Bilateral lower lobe wheezing  noted. Gastrointestinal: Soft and nontender. No distention. No abdominal bruits.  Musculoskeletal: Patient has generalized body aches. Neurologic:  Normal speech and language. No gross focal neurologic deficits are appreciated. No gait instability.  Patient denies headache. Skin:  Skin is warm, dry and intact. No rash noted. Psychiatric: Patient is tearful on exam reporting that she has home life problems.  Patient does report that she is not being abused at home and she feels safe.  Speech and behavior are normal.  ____________________________________________   LABS (all labs ordered are listed, but only abnormal results are displayed)  Labs Reviewed  RESP PANEL BY RT-PCR (FLU A&B, COVID) [id number]   ____________________________________________  EKG   ____________________________________________  RADIOLOGY  ED MD interpretation:    Official radiology report(s): No results found.  ____________________________________________   PROCEDURES  Procedure(s) performed: None  Procedures  Critical Care performed: No  ____________________________________________   INITIAL IMPRESSION / ASSESSMENT AND PLAN / ED COURSE     35 year old female reports to the emergency room with possible COVID symptoms.  See HPI for further explanation.  Will COVID test, flu test.  Patient is noted to have bilateral lower lobe wheezing.  She has no known history of asthma.  She does smoke half pack of cigarettes per day.  We will give patient DuoNeb and reevaluate lung status at that time.  After DuoNeb lung fields are clear in all lobes.  Respiratory status with respirations even and unlabored in no respiratory distress at this time.  Is requesting to go home and be notified of positive COVID or flu test.  She has been advised to follow her MyChart for these results.  Patient will be given Jerilynn Som for reported cough.  She will also be given albuterol inhaler for wheezing.  Gust with patient  quarantine if positive COVID test.  Patient was discharged in stable condition at this time.      ____________________________________________   FINAL CLINICAL IMPRESSION(S) / ED DIAGNOSES  Final diagnoses:  Viral upper respiratory tract infection  Suspected COVID-19 virus infection     ED Discharge Orders          Ordered    benzonatate (TESSALON PERLES) 100 MG capsule  3 times daily PRN        05/05/21 1529    albuterol (VENTOLIN HFA) 108 (90 Base) MCG/ACT inhaler  Every 6 hours PRN        05/05/21 1529             Note:  This document was prepared using Dragon voice recognition software and may include unintentional dictation errors.     Herschell Dimes, NP 05/05/21 1538    Arnaldo Natal, MD 05/05/21 [phone]

## 2021-05-05 NOTE — ED Triage Notes (Signed)
Pt c/o generalized body aches with cough and congestion since thursday

## 2021-12-01 ENCOUNTER — Other Ambulatory Visit: Payer: Self-pay

## 2021-12-01 ENCOUNTER — Encounter (HOSPITAL_COMMUNITY): Payer: Self-pay

## 2021-12-01 ENCOUNTER — Emergency Department (HOSPITAL_COMMUNITY): Payer: Self-pay

## 2021-12-01 ENCOUNTER — Emergency Department (HOSPITAL_COMMUNITY)
Admission: EM | Admit: 2021-12-01 | Discharge: 2021-12-01 | Disposition: A | Discharge status: Home / Self Care | Payer: Self-pay | Attending: Emergency Medicine | Admitting: Emergency Medicine

## 2021-12-01 DIAGNOSIS — R197 Diarrhea, unspecified: Secondary | ICD-10-CM | POA: Insufficient documentation

## 2021-12-01 DIAGNOSIS — R1084 Generalized abdominal pain: Secondary | ICD-10-CM | POA: Insufficient documentation

## 2021-12-01 DIAGNOSIS — R110 Nausea: Secondary | ICD-10-CM | POA: Insufficient documentation

## 2021-12-01 LAB — CBC WITH DIFFERENTIAL/PLATELET
Abs Immature Granulocytes: 0.02 10*3/uL (ref 0.00–0.07)
Basophils Absolute: 0 10*3/uL (ref 0.0–0.1)
Basophils Relative: 1 %
Eosinophils Absolute: 0.1 10*3/uL (ref 0.0–0.5)
Eosinophils Relative: 2 %
HCT: 44.6 % (ref 36.0–46.0)
Hemoglobin: 15.1 g/dL — ABNORMAL HIGH (ref 12.0–15.0)
Immature Granulocytes: 0 %
Lymphocytes Relative: 31 %
Lymphs Abs: 2.6 10*3/uL (ref 0.7–4.0)
MCH: 30.9 pg (ref 26.0–34.0)
MCHC: 33.9 g/dL (ref 30.0–36.0)
MCV: 91.2 fL (ref 80.0–100.0)
Monocytes Absolute: 0.7 10*3/uL (ref 0.1–1.0)
Monocytes Relative: 8 %
Neutro Abs: 4.9 10*3/uL (ref 1.7–7.7)
Neutrophils Relative %: 58 %
Platelets: 314 10*3/uL (ref 150–400)
RBC: 4.89 MIL/uL (ref 3.87–5.11)
RDW: 13.2 % (ref 11.5–15.5)
WBC: 8.4 10*3/uL (ref 4.0–10.5)
nRBC: 0 % (ref 0.0–0.2)

## 2021-12-01 LAB — COMPREHENSIVE METABOLIC PANEL
ALT: 11 U/L (ref 0–44)
AST: 15 U/L (ref 15–41)
Albumin: 3.9 g/dL (ref 3.5–5.0)
Alkaline Phosphatase: 48 U/L (ref 38–126)
Anion gap: 9 (ref 5–15)
BUN: 9 mg/dL (ref 6–20)
CO2: 25 mmol/L (ref 22–32)
Calcium: 9.3 mg/dL (ref 8.9–10.3)
Chloride: 106 mmol/L (ref 98–111)
Creatinine, Ser: 0.76 mg/dL (ref 0.44–1.00)
GFR, Estimated: >60 mL/min (ref >60)
Glucose, Bld: 117 mg/dL — ABNORMAL HIGH (ref 70–99)
Potassium: 3.6 mmol/L (ref 3.5–5.1)
Sodium: 140 mmol/L (ref 135–145)
Total Bilirubin: 0.5 mg/dL (ref 0.3–1.2)
Total Protein: 6.6 g/dL (ref 6.5–8.1)

## 2021-12-01 LAB — URINALYSIS, ROUTINE W REFLEX MICROSCOPIC
Bilirubin Urine: NEGATIVE
Glucose, UA: NEGATIVE mg/dL
Hgb urine dipstick: NEGATIVE
Ketones, ur: NEGATIVE mg/dL
Nitrite: NEGATIVE
Protein, ur: NEGATIVE mg/dL
Specific Gravity, Urine: 1.025 (ref 1.005–1.030)
pH: 7 (ref 5.0–8.0)

## 2021-12-01 LAB — I-STAT BETA HCG BLOOD, ED (MC, WL, AP ONLY): I-stat hCG, quantitative: <5 m[IU]/mL (ref <5)

## 2021-12-01 LAB — TYPE AND SCREEN
ABO/RH(D): O POS
Antibody Screen: NEGATIVE

## 2021-12-01 LAB — LIPASE, BLOOD: Lipase: 35 U/L (ref 11–51)

## 2021-12-01 LAB — POC OCCULT BLOOD, ED: Fecal Occult Bld: NEGATIVE

## 2021-12-01 MED ORDER — ONDANSETRON HCL 4 MG PO TABS: 4.0000 mg | ORAL_TABLET | Freq: Four times a day (QID) | ORAL | 0 refills | Status: AC

## 2021-12-01 MED ORDER — KETOROLAC TROMETHAMINE 15 MG/ML IJ SOLN
15.0000 mg | Freq: Once | INTRAMUSCULAR | Status: AC
  Administered 2021-12-01: 15 mg via INTRAVENOUS
  Filled 2021-12-01: qty 1

## 2021-12-01 MED ORDER — SODIUM CHLORIDE 0.9 % IV BOLUS
1000.0000 mL | Freq: Once | INTRAVENOUS | Status: AC
  Administered 2021-12-01: 1000 mL via INTRAVENOUS

## 2021-12-01 MED ORDER — IOHEXOL 350 MG/ML SOLN
80.0000 mL | Freq: Once | INTRAVENOUS | Status: AC | PRN
  Administered 2021-12-01: 80 mL via INTRAVENOUS

## 2021-12-01 MED ORDER — ONDANSETRON HCL 4 MG/2ML IJ SOLN
4.0000 mg | Freq: Once | INTRAMUSCULAR | Status: AC
  Administered 2021-12-01: 4 mg via INTRAVENOUS
  Filled 2021-12-01: qty 2

## 2021-12-01 NOTE — ED Provider Notes (Signed)
?[facility] ?Provider Note ? ? ?CSN: [id number] ?Arrival date & time: 12/01/21  1049 ? ?  ? ?History ? ?Chief Complaint  ?Patient presents with  ? Abdominal Pain  ? ? ?Anna Aguirre is a 36 y.o. female. ? ?36 year old female with prior medical history as detailed below presents for evaluation.  Patient reports onset of loose watery stool and diarrhea for the last 24 hours.  Patient reports that her stool was " darker" at times.  She denies any vomiting.  She reports mild intermittent nausea.  She denies any blood in her stool. ? ?She denies fever.  She complains of diffuse abdominal cramping. ? ?The history is provided by the patient and medical records.  ?Abdominal Pain ?Pain location:  Generalized ?Pain quality: cramping   ?Pain radiates to:  Does not radiate ?Pain severity:  Mild ?Onset quality:  Sudden ?Duration:  1 day ?Timing:  Constant ?Progression:  Waxing and waning ?Chronicity:  New ? ?  ? ?Home Medications ?Prior to Admission medications   ?Medication Sig Start Date End Date Taking? Authorizing Provider  ?albuterol (VENTOLIN HFA) 108 (90 Base) MCG/ACT inhaler Inhale 2 puffs into the lungs every 6 (six) hours as needed for wheezing or shortness of breath. 05/05/21   Willaim Rayas, NP  ?cephALEXin (KEFLEX) 500 MG capsule Take 1 capsule (500 mg total) by mouth 3 (three) times daily. 02/15/20   Caryn Section Linden Dolin, PA-C  ?cyclobenzaprine (FLEXERIL) 10 MG tablet Take 1 tablet (10 mg total) by mouth 3 (three) times daily as needed. 02/15/20   Caryn Section Linden Dolin, PA-C  ?HYDROcodone-acetaminophen (NORCO/VICODIN) 5-325 MG tablet Take 1 tablet by mouth every 6 (six) hours as needed for moderate pain. 02/15/20   Versie Starks, PA-C  ?   ? ?Allergies    ?Codeine   ? ?Review of Systems   ?Review of Systems  ?Gastrointestinal:  Positive for abdominal pain.  ?All other systems reviewed and are negative. ? ?Physical Exam ?Updated Vital Signs ?BP 114/78   Pulse 80   Temp 97.9 ?F (36.6 ?C)  (Oral)   Resp 16   Ht 5\' 5"  (1.651 m)   Wt 65.8 kg   SpO2 99%   BMI 24.13 kg/m?  ?Physical Exam ?Vitals and nursing note reviewed.  ?Constitutional:   ?   General: She is not in acute distress. ?   Appearance: Normal appearance. She is well-developed.  ?HENT:  ?   Head: Normocephalic and atraumatic.  ?Eyes:  ?   Conjunctiva/sclera: Conjunctivae normal.  ?   Pupils: Pupils are equal, round, and reactive to light.  ?Cardiovascular:  ?   Rate and Rhythm: Normal rate and regular rhythm.  ?   Heart sounds: Normal heart sounds.  ?Pulmonary:  ?   Effort: Pulmonary effort is normal. No respiratory distress.  ?   Breath sounds: Normal breath sounds.  ?Abdominal:  ?   General: There is no distension.  ?   Palpations: Abdomen is soft.  ?   Tenderness: There is no abdominal tenderness.  ?Musculoskeletal:     ?   General: No deformity. Normal range of motion.  ?   Cervical back: Normal range of motion and neck supple.  ?Skin: ?   General: Skin is warm and dry.  ?Neurological:  ?   General: No focal deficit present.  ?   Mental Status: She is alert and oriented to person, place, and time.  ? ? ?ED Results / Procedures / Treatments   ?  Labs ?(all labs ordered are listed, but only abnormal results are displayed) ?Labs Reviewed  ?URINALYSIS, ROUTINE W REFLEX MICROSCOPIC - Abnormal; Notable for the following components:  ?    Result Value  ? APPearance HAZY (*)   ? Leukocytes,Ua SMALL (*)   ? Bacteria, UA RARE (*)   ? All other components within normal limits  ?COMPREHENSIVE METABOLIC PANEL - Abnormal; Notable for the following components:  ? Glucose, Bld 117 (*)   ? All other components within normal limits  ?CBC WITH DIFFERENTIAL/PLATELET - Abnormal; Notable for the following components:  ? Hemoglobin 15.1 (*)   ? All other components within normal limits  ?LIPASE, BLOOD  ?I-STAT BETA HCG BLOOD, ED ([facility], [facility], [facility] ONLY)  ?POC OCCULT BLOOD, ED  ?TYPE AND SCREEN  ? ? ?EKG ?None ? ?Radiology ?CT ABDOMEN PELVIS W CONTRAST ? ?Result  Date: 12/01/2021 ?CLINICAL DATA:  Abdominal pain. EXAM: CT ABDOMEN AND PELVIS WITH CONTRAST TECHNIQUE: Multidetector CT imaging of the abdomen and pelvis was performed using the standard protocol following bolus administration of intravenous contrast. RADIATION DOSE REDUCTION: This exam was performed according to the departmental dose-optimization program which includes automated exposure control, adjustment of the mA and/or kV according to patient size and/or use of iterative reconstruction technique. CONTRAST:  39mL OMNIPAQUE IOHEXOL 350 MG/ML SOLN COMPARISON:  CT scan of the abdomen and pelvis May 02, 2016. FINDINGS: Lower chest: No acute abnormality. Hepatobiliary: No focal liver abnormality is seen. No gallstones, gallbladder wall thickening, or biliary dilatation. Pancreas: Unremarkable. No pancreatic ductal dilatation or surrounding inflammatory changes. Spleen: Normal in size without focal abnormality. Adrenals/Urinary Tract: Adrenal glands are unremarkable. Kidneys are normal, without renal calculi, focal lesion, or hydronephrosis. Bladder is unremarkable. Stomach/Bowel: The stomach is normal in appearance. The duodenum and jejunum are unremarkable. There is wall thickening associated with the ileum, including the terminal ileum. This finding is well seen on coronal images image 30 through 40. The mesenteric vessels supplying the thick walled loops of bowel are prominent with mild increase fat attenuation in these areas. No pneumatosis. The colon is unremarkable. The appendix is normal. Vascular/Lymphatic: No significant vascular findings are present. No enlarged abdominal or pelvic lymph nodes. Reproductive: The uterus and right ovary are normal. There is a dominant cyst/follicle measuring 2.8 cm in the left ovary, simple in appearance. No follow-up imaging is recommended. Other: There is a small amount of free fluid in the right pericolic gutter, extending into the pelvis, likely secondary to the small  bowel process described above. No free air. Musculoskeletal: No acute or significant osseous findings. IMPRESSION: 1. Wall thickening associated with the distal third of the ileum, including the terminal ileum, with mild adjacent fat stranding and prominence of the supplying mesenteric vessels consistent with enteritis. The distribution is concerning for inflammatory bowel disease/Crohn's. 2. There is a small amount of free fluid in the right pericolic gutter extending into the pelvis, likely secondary to the small bowel process described above. Electronically Signed   By: Dorise Bullion III M.D.   On: 12/01/2021 13:02   ? ?Procedures ?Procedures  ? ? ?Medications Ordered in ED ?Medications  ?ondansetron (ZOFRAN) injection 4 mg (has no administration in time range)  ?ketorolac (TORADOL) 15 MG/ML injection 15 mg (has no administration in time range)  ?sodium chloride 0.9 % bolus 1,000 mL (0 mLs Intravenous Stopped 12/01/21 1248)  ?sodium chloride 0.9 % bolus 1,000 mL (1,000 mLs Intravenous New Bag/Given 12/01/21 1248)  ?iohexol (OMNIPAQUE) 350 MG/ML injection 80 mL (80  mLs Intravenous Contrast Given 12/01/21 1237)  ? ? ?ED Course/ Medical Decision Making/ A&P ?  ?                        ?Medical Decision Making ?Amount and/or Complexity of Data Reviewed ?Labs: ordered. ?Radiology: ordered. ? ?Risk ?Prescription drug management. ? ? ? ?Medical Screen Complete ? ?This patient presented to the ED with complaint of abdominal cramping, diarrhea. ? ?This complaint involves an extensive number of treatment options. The initial differential diagnosis includes, but is not limited to, viral versus bacterial enteritis, metabolic abnormality, etc. ? ?This presentation is: Acute, Self-Limited, Previously Undiagnosed, Uncertain Prognosis, Complicated, Systemic Symptoms, and Threat to Life/Bodily Function ? ?Patient is presenting with approximately 24 hours of loose watery stools. ? ?Patient without vomiting. ? ?Patient without  fever. ? ?Patient's stool guaiac is negative. ? ?Screening labs obtained are without significant abnormality. ? ?CT imaging is consistent with likely enteritis.  There is notation made by radiology of involvement of the

## 2021-12-01 NOTE — ED Triage Notes (Signed)
Reports diarrhea since Friday.  Reports it was black so she started taking pepto.  Denies n./v ?

## 2021-12-01 NOTE — Discharge Instructions (Addendum)
Return for any problem.  ? ?Drink plenty of fluids. ?

## 2021-12-01 NOTE — ED Notes (Signed)
Up to br tolerated well  edp in to see ?

## 2023-01-14 IMAGING — CT CT ABD-PELV W/ CM
2 of 4 series · 11 of 46 positions shown, 12 images · IV contrast (APPLIED)
Comparison: CT scan of the abdomen and pelvis May 02, 2016.

CLINICAL DATA: Abdominal pain.

EXAM:
CT ABDOMEN AND PELVIS WITH CONTRAST
TECHNIQUE: Multidetector CT imaging of the abdomen and pelvis was performed
using the standard protocol following bolus administration of
intravenous contrast.

[Series 3: abdomen 5.0 · axial · 0.48mm/px · z∈[+987,+1337]mm · 8 of 92 slices shown, 9 images]
[im 11/92  soft-tissue]
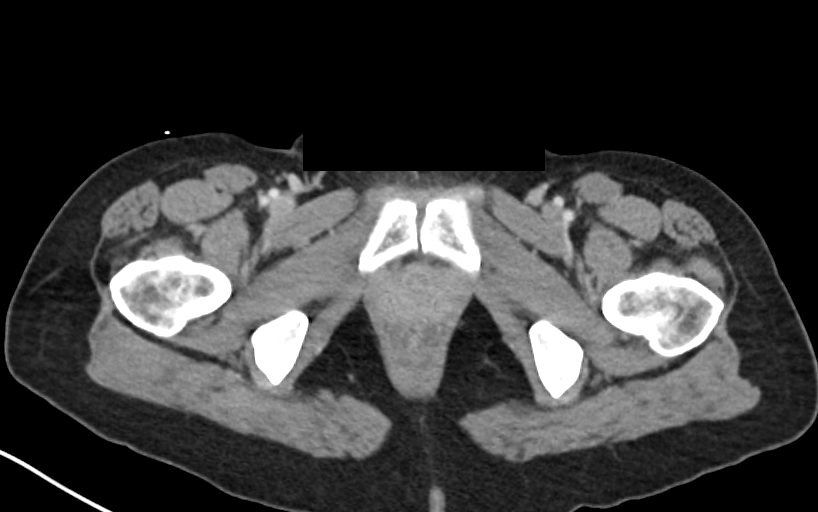
[im 11/92  bone]
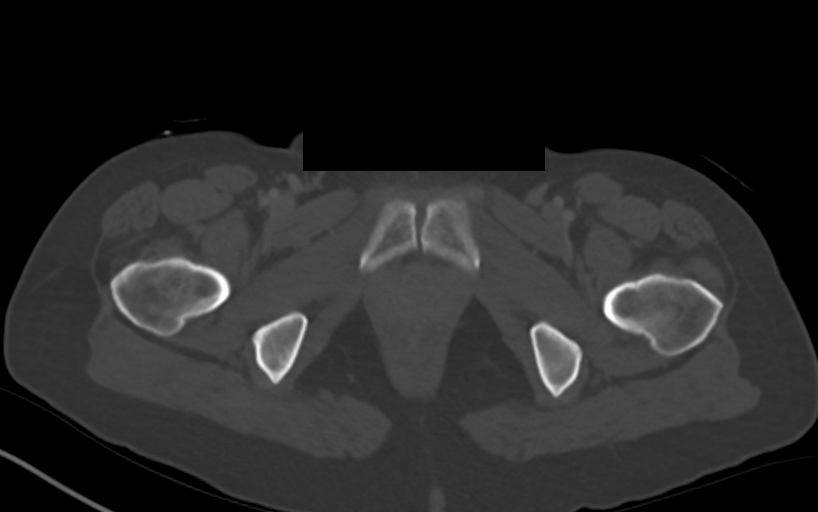
[im 21/92  soft-tissue]
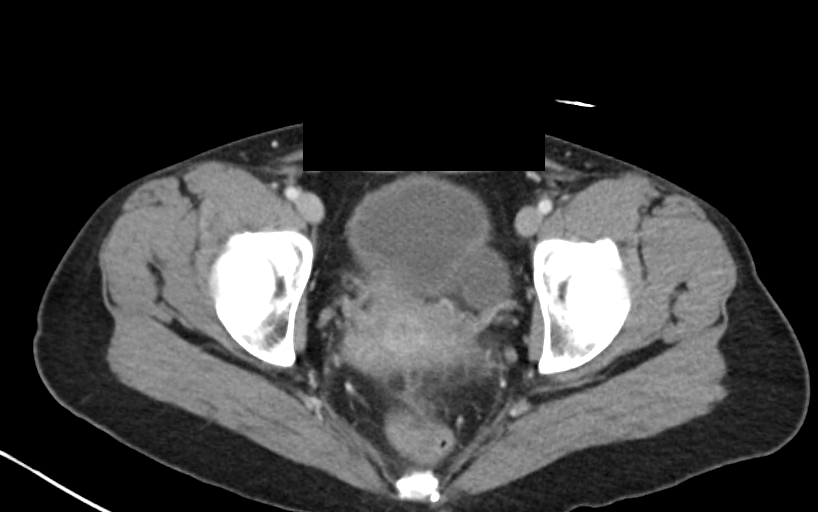
[im 31/92  soft-tissue]
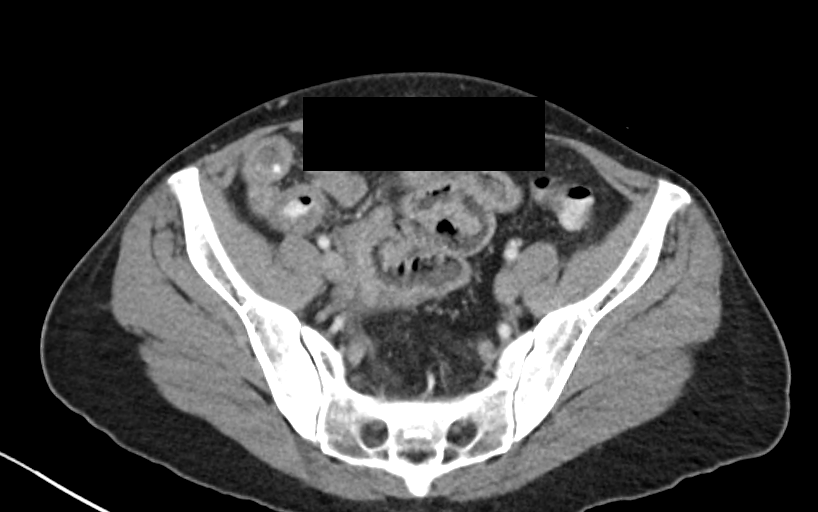
[im 41/92  soft-tissue]
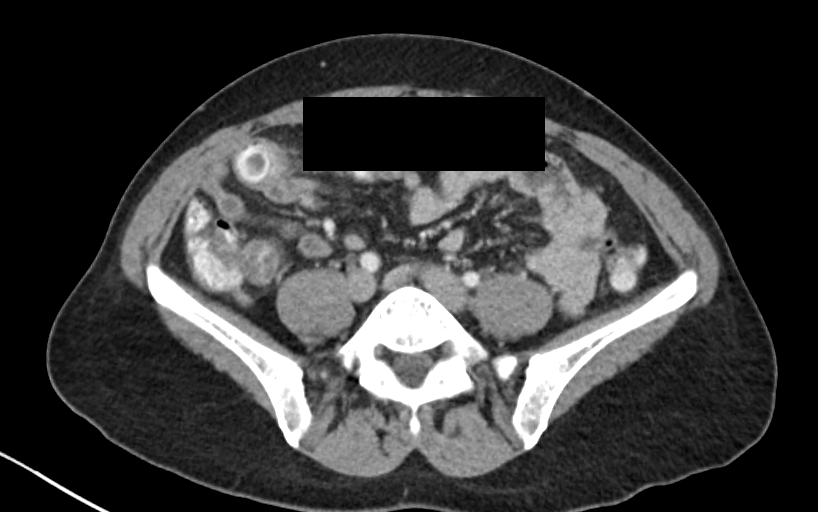
[im 51/92  soft-tissue]
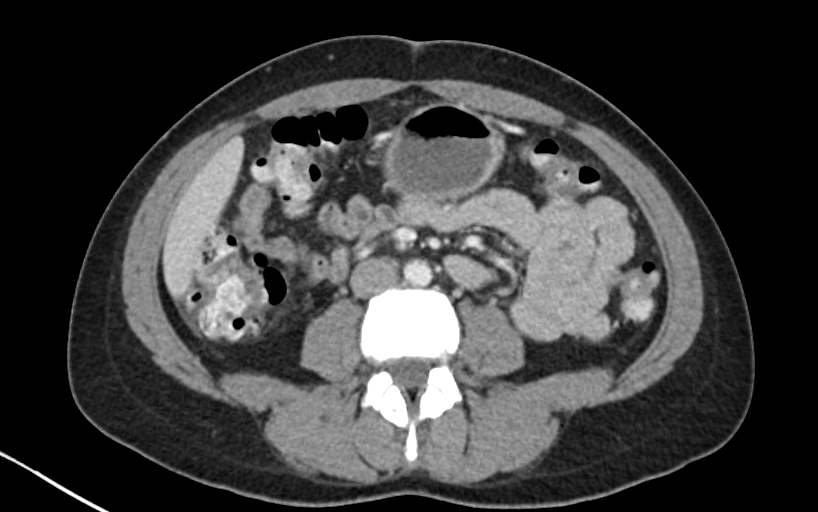
[im 61/92  soft-tissue]
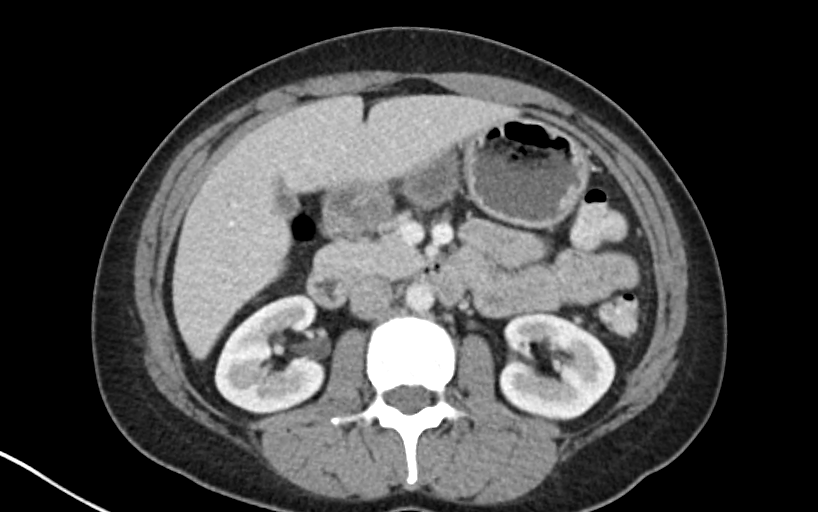
[im 71/92  soft-tissue]
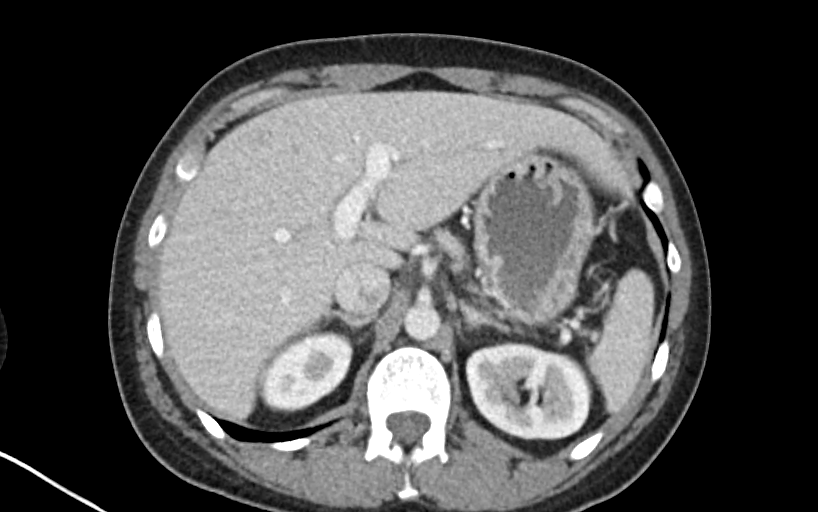
[im 81/92  soft-tissue]
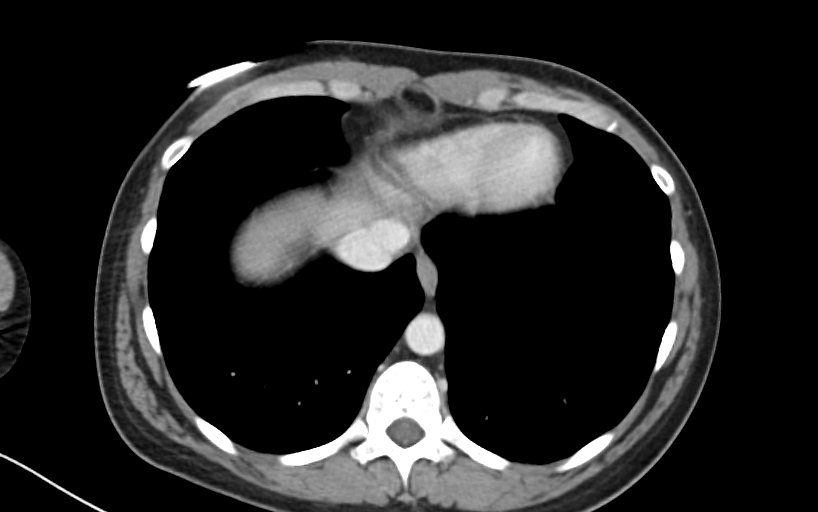

[Series 6: abdomen 3.0 mpr cor · coronal · 0.69mm/px · 3 of 83 slices shown]
[im 28/83  soft-tissue]
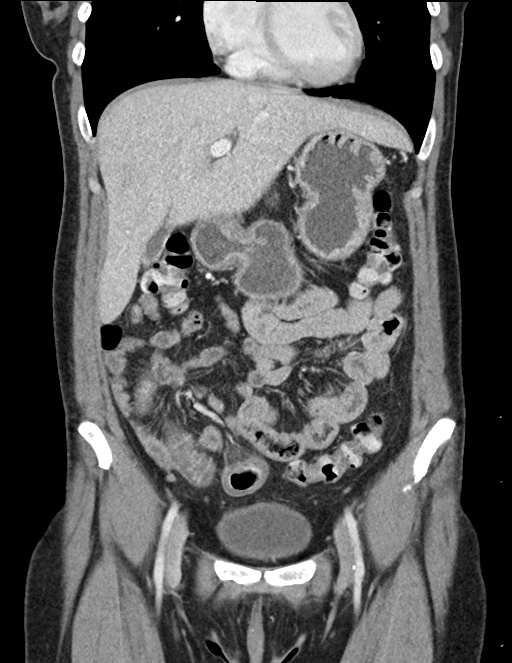
[im 37/83  soft-tissue]
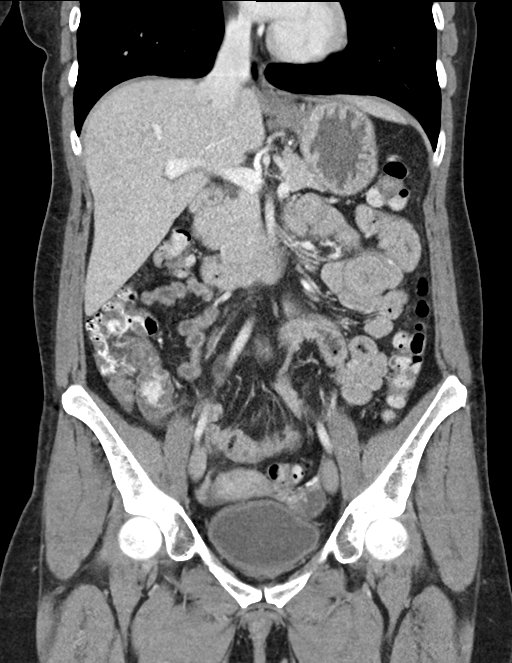
[im 46/83  soft-tissue]
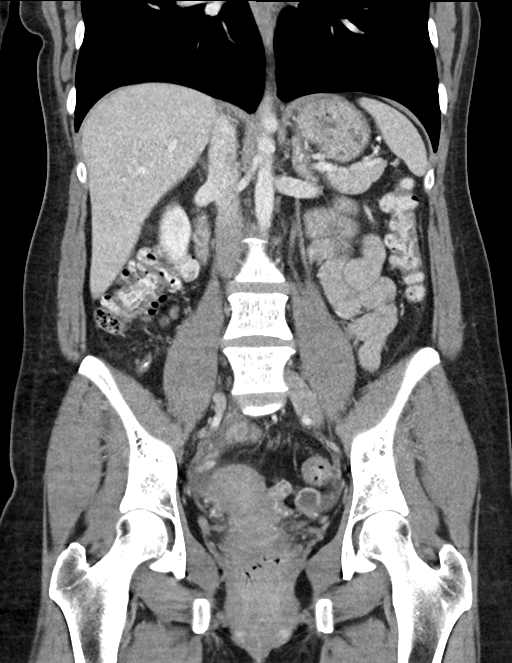

[11 of 46 positions shown; findings below may reference images not displayed]

RADIATION DOSE REDUCTION: This exam was performed according to the
departmental dose-optimization program which includes automated
exposure control, adjustment of the mA and/or kV according to
patient size and/or use of iterative reconstruction technique.

CONTRAST:  80mL OMNIPAQUE IOHEXOL 350 MG/ML SOLN
FINDINGS: Lower chest: No acute abnormality.

Hepatobiliary: No focal liver abnormality is seen. No gallstones,
gallbladder wall thickening, or biliary dilatation.

Pancreas: Unremarkable. No pancreatic ductal dilatation or
surrounding inflammatory changes.

Spleen: Normal in size without focal abnormality.

Adrenals/Urinary Tract: Adrenal glands are unremarkable. Kidneys are
normal, without renal calculi, focal lesion, or hydronephrosis.
Bladder is unremarkable.

Stomach/Bowel: The stomach is normal in appearance. The duodenum and
jejunum are unremarkable. There is wall thickening associated with
the ileum, including the terminal ileum. This finding is well seen
on coronal images image 30 through 40. The mesenteric vessels
supplying the thick walled loops of bowel are prominent with mild
increase fat attenuation in these areas. No pneumatosis. The colon
is unremarkable. The appendix is normal.

Vascular/Lymphatic: No significant vascular findings are present. No
enlarged abdominal or pelvic lymph nodes.

Reproductive: The uterus and right ovary are normal. There is a
dominant cyst/follicle measuring 2.8 cm in the left ovary, simple in
appearance. No follow-up imaging is recommended.

Other: There is a small amount of free fluid in the right pericolic
gutter, extending into the pelvis, likely secondary to the small
bowel process described above. No free air.

Musculoskeletal: No acute or significant osseous findings.
IMPRESSION: 1. Wall thickening associated with the distal third of the ileum,
including the terminal ileum, with mild adjacent fat stranding and
prominence of the supplying mesenteric vessels consistent with
enteritis. The distribution is concerning for inflammatory bowel
disease/Crohn's.
2. There is a small amount of free fluid in the right pericolic
gutter extending into the pelvis, likely secondary to the small
bowel process described above.
